# Patient Record
Sex: Female | Born: 2000 | Marital: Single | Smoking: Never smoker
Health system: Southern US, Community
[De-identification: ages and names within clinical notes are randomized; demographics above are authoritative.]

---
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
## 2022-07-21 ENCOUNTER — Telehealth: Payer: BC Managed Care – PPO

## 2022-07-22 ENCOUNTER — Encounter (HOSPITAL_COMMUNITY): Payer: Self-pay

## 2022-07-22 ENCOUNTER — Ambulatory Visit (HOSPITAL_COMMUNITY)
Admission: RE | Admit: 2022-07-22 | Discharge: 2022-07-22 | Disposition: A | Discharge status: Home / Self Care | Payer: BC Managed Care – PPO | Source: Ambulatory Visit | Attending: Internal Medicine | Admitting: Internal Medicine

## 2022-07-22 VITALS — BP 112/76 | HR 82 | Temp 97.8°F | Resp 18

## 2022-07-22 DIAGNOSIS — Z3202 Encounter for pregnancy test, result negative: Secondary | ICD-10-CM | POA: Insufficient documentation

## 2022-07-22 DIAGNOSIS — N3001 Acute cystitis with hematuria: Secondary | ICD-10-CM | POA: Insufficient documentation

## 2022-07-22 LAB — POCT URINALYSIS DIPSTICK, ED / UC
Bilirubin Urine: NEGATIVE
Glucose, UA: NEGATIVE mg/dL
Ketones, ur: 40 mg/dL — AB
Nitrite: NEGATIVE
Protein, ur: 100 mg/dL — AB
Specific Gravity, Urine: 1.025 (ref 1.005–1.030)
Urobilinogen, UA: 1 mg/dL (ref 0.0–1.0)
pH: 7.5 (ref 5.0–8.0)

## 2022-07-22 LAB — POC URINE PREG, ED: Preg Test, Ur: NEGATIVE

## 2022-07-22 MED ORDER — CEPHALEXIN 500 MG PO CAPS: 500.0000 mg | ORAL_CAPSULE | Freq: Two times a day (BID) | ORAL | 0 refills | Status: AC

## 2022-07-22 NOTE — ED Triage Notes (Signed)
Pt reports dysuria for several days.

## 2022-07-22 NOTE — ED Provider Notes (Signed)
[facility]    CSN: [id number] Arrival date & time: 07/22/22  1845      History   Chief Complaint Chief Complaint  Patient presents with   Urinary Frequency    Burning sensation when peeing Blood in urine - Entered by patient    HPI Kimberly Paul is a 22 y.o. female.   Patient presents to urgent care for evaluation of gross hematuria, dysuria, and urinary frequency that started 3-4 days ago. Denies history of frequent UTIs and recent antibiotic use. She is not a diabetic and does not take an SGLT-2 inhibitor. No nausea, vomiting, diarrhea, abdominal pain, low back pain, fever/chills, flank pain, viral URI symptoms, headache, dizziness, or decreased appetite. She is not a smoker and denies drug use. She admits to drinking only 1 bottle of water per day and excessive soda intake. She also admits to drinking lots of soda and caffeine. No vaginal symptoms or concern for STD. Unsure of pregnancy status, last menstrual cycle was 06/26/22 and she is sexually active with males without condom use. She has not attempted use of any over the counter medicines before coming to urgent care for symptoms.    Urinary Frequency    History reviewed. No pertinent past medical history.  Patient Active Problem List   Diagnosis Date Noted   Chronic pain of right knee 12/27/2020   Skin tag of perianal region 12/02/2017   Vitamin D deficiency 09/26/2014   Vaginitis 06/12/2014    History reviewed. No pertinent surgical history.  OB History   No obstetric history on file.      Home Medications    Prior to Admission medications   Medication Sig Start Date End Date Taking? Authorizing Provider  cephALEXin (KEFLEX) 500 MG capsule Take 1 capsule (500 mg total) by mouth 2 (two) times daily for 7 days. 07/22/22 07/29/22 Yes Geraldina Parrott, Stasia Cavalier, FNP    Family History Family History  Problem Relation Age of Onset   Obesity Mother    Hyperlipidemia Father    Obesity Father     Diabetes Maternal Grandmother    Diabetes Paternal Grandmother    Diabetes Paternal Grandfather    Asthma Brother    Hypertension Maternal Aunt     Social History Social History   Tobacco Use   Smoking status: Never   Smokeless tobacco: Never  Substance Use Topics   Drug use: Never     Allergies   Patient has no known allergies.   Review of Systems Review of Systems  Genitourinary:  Positive for frequency.     Physical Exam Triage Vital Signs ED Triage Vitals [07/22/22 1939]  Enc Vitals Group     BP 112/76     Pulse Rate 82     Resp 18     Temp 97.8 F (36.6 C)     Temp Source Oral     SpO2 100 %     Weight      Height      Head Circumference      Peak Flow      Pain Score      Pain Loc      Pain Edu?      Excl. in [place]?    No data found.  Updated Vital Signs BP 112/76 (BP Location: Left Arm)   Pulse 82   Temp 97.8 F (36.6 C) (Oral)   Resp 18   LMP 06/26/2022 (Approximate)   SpO2 100%   Visual Acuity Right Eye Distance:  Left Eye Distance:   Bilateral Distance:    Right Eye Near:   Left Eye Near:    Bilateral Near:     Physical Exam   UC Treatments / Results  Labs (all labs ordered are listed, but only abnormal results are displayed) Labs Reviewed  URINE CULTURE - Abnormal; Notable for the following components:      Result Value   Culture >=100,000 COLONIES/mL ESCHERICHIA COLI (*)    Organism ID, Bacteria ESCHERICHIA COLI (*)    All other components within normal limits  POCT URINALYSIS DIPSTICK, ED / UC - Abnormal; Notable for the following components:   Ketones, ur 40 (*)    Hgb urine dipstick LARGE (*)    Protein, ur 100 (*)    Leukocytes,Ua SMALL (*)    All other components within normal limits  POC URINE PREG, ED    EKG   Radiology No results found.  Procedures Procedures (including critical care time)  Medications Ordered in UC Medications - No data to display  Initial Impression / Assessment and Plan / UC  Course  I have reviewed the triage vital signs and the nursing notes.  Pertinent labs & imaging results that were available during my care of the patient were reviewed by me and considered in my medical decision making (see chart for details).   1. Acute cystitis with hematuria, negative pregnancy test Presentation is consistent with acute uncomplicated cystitis. Patient is nontoxic in appearance with hemodynamically stable vital signs. Low suspicion for acute pyelonephritis. Low suspicion for kidney stone or infected stone. Urine pregnancy is negative. No indication for labs or imaging at this time.  Keflex sent to pharmacy. Denies allergies to antibiotics. Urine culture pending. Patient to push fluids to stay well hydrated and reduce intake of known urinary irritants.  Discussed physical exam and available lab work findings in clinic with patient.  Counseled patient regarding appropriate use of medications and potential side effects for all medications recommended or prescribed today. Discussed red flag signs and symptoms of worsening condition,when to call the PCP office, return to urgent care, and when to seek higher level of care in the emergency department. Patient verbalizes understanding and agreement with plan. All questions answered. Patient discharged in stable condition.    Final Clinical Impressions(s) / UC Diagnoses   Final diagnoses:  Acute cystitis with hematuria  Negative pregnancy test     Discharge Instructions      Your urine shows you likely have a urinary tract infection. I have sent your urine for culture to confirm this. We will go ahead and have you start taking antibiotics due to your symptoms.  Take antibiotic as directed.  (Keflex '500mg'$  every 12 hours for 7 days) If you develop diarrhea while taking this medication you may purchase an over-the-counter probiotic or eat yogurt with live active cultures.  To avoid GI upset please take this medication with food. I  have sent your urine for culture to see what type of bacteria grows. We will call you if we need to change the treatment plan based on the results of your urine culture.  If you develop any new or worsening symptoms or do not improve in the next 2 to 3 days, please return.  If your symptoms are severe, please go to the emergency room.  Follow-up with your primary care provider for further evaluation and management of your symptoms as well as ongoing wellness visits.  I hope you feel better!  ED Prescriptions     Medication Sig Dispense Auth. Provider   cephALEXin (KEFLEX) 500 MG capsule Take 1 capsule (500 mg total) by mouth 2 (two) times daily for 7 days. 14 capsule Talbot Grumbling, FNP      PDMP not reviewed this encounter.   Talbot Grumbling, [place] 07/24/22 2122

## 2022-07-22 NOTE — Discharge Instructions (Addendum)
Your urine shows you likely have a urinary tract infection. I have sent your urine for culture to confirm this. We will go ahead and have you start taking antibiotics due to your symptoms.  Take antibiotic as directed.  (Keflex '500mg'$  every 12 hours for 7 days) If you develop diarrhea while taking this medication you may purchase an over-the-counter probiotic or eat yogurt with live active cultures.  To avoid GI upset please take this medication with food. I have sent your urine for culture to see what type of bacteria grows. We will call you if we need to change the treatment plan based on the results of your urine culture.  If you develop any new or worsening symptoms or do not improve in the next 2 to 3 days, please return.  If your symptoms are severe, please go to the emergency room.  Follow-up with your primary care provider for further evaluation and management of your symptoms as well as ongoing wellness visits.  I hope you feel better!

## 2022-07-24 LAB — URINE CULTURE: Culture: >=100000 — AB

## 2022-08-04 DIAGNOSIS — Z113 Encounter for screening for infections with a predominantly sexual mode of transmission: Secondary | ICD-10-CM | POA: Diagnosis not present

## 2022-08-04 DIAGNOSIS — Z01419 Encounter for gynecological examination (general) (routine) without abnormal findings: Secondary | ICD-10-CM | POA: Diagnosis not present

## 2022-08-04 DIAGNOSIS — Z3202 Encounter for pregnancy test, result negative: Secondary | ICD-10-CM | POA: Diagnosis not present

## 2022-09-03 DIAGNOSIS — N898 Other specified noninflammatory disorders of vagina: Secondary | ICD-10-CM | POA: Diagnosis not present

## 2022-09-03 DIAGNOSIS — Z113 Encounter for screening for infections with a predominantly sexual mode of transmission: Secondary | ICD-10-CM | POA: Diagnosis not present

## 2022-09-19 DIAGNOSIS — N939 Abnormal uterine and vaginal bleeding, unspecified: Secondary | ICD-10-CM | POA: Diagnosis not present

## 2022-10-06 DIAGNOSIS — H5213 Myopia, bilateral: Secondary | ICD-10-CM | POA: Diagnosis not present

## 2022-11-10 DIAGNOSIS — Z3040 Encounter for surveillance of contraceptives, unspecified: Secondary | ICD-10-CM | POA: Diagnosis not present

## 2022-12-23 ENCOUNTER — Ambulatory Visit (HOSPITAL_COMMUNITY)
Admission: EM | Admit: 2022-12-23 | Discharge: 2022-12-23 | Disposition: A | Discharge status: Home / Self Care | Payer: BC Managed Care – PPO | Attending: Emergency Medicine | Admitting: Emergency Medicine

## 2022-12-23 ENCOUNTER — Encounter (HOSPITAL_COMMUNITY): Payer: Self-pay

## 2022-12-23 DIAGNOSIS — M25561 Pain in right knee: Secondary | ICD-10-CM

## 2022-12-23 DIAGNOSIS — G8929 Other chronic pain: Secondary | ICD-10-CM | POA: Diagnosis not present

## 2022-12-23 MED ORDER — PREDNISONE 20 MG PO TABS: 40.0000 mg | ORAL_TABLET | Freq: Every day | ORAL | 0 refills | Status: DC

## 2022-12-23 MED ORDER — CYCLOBENZAPRINE HCL 10 MG PO TABS: 10.0000 mg | ORAL_TABLET | Freq: Every day | ORAL | 0 refills | Status: DC

## 2022-12-23 NOTE — ED Provider Notes (Signed)
[facility]    CSN: [id number] Arrival date & time: 12/23/22  1206      History   Chief Complaint Chief Complaint  Patient presents with   Knee Pain    HPI Jesselyn Naab is a 22 y.o. female.   Patient presents for evaluation of anterior right knee pain and swelling present for 2 days.  Symptoms occurring intermittently.  Having to use cane for stability, independent at baseline.  Denies injury or trauma.  Has been ongoing issue since the age of 7, not currently followed by orthopedics but has seen in the past.  Denies numbness or tingling.  Has attempted use of Tylenol which has been ineffective.     History reviewed. No pertinent past medical history.  Patient Active Problem List   Diagnosis Date Noted   Chronic pain of right knee 12/27/2020   Skin tag of perianal region 12/02/2017   Vitamin D deficiency 09/26/2014   Vaginitis 06/12/2014    History reviewed. No pertinent surgical history.  OB History   No obstetric history on file.      Home Medications    Prior to Admission medications   Not on File    Family History Family History  Problem Relation Age of Onset   Obesity Mother    Hyperlipidemia Father    Obesity Father    Diabetes Maternal Grandmother    Diabetes Paternal Grandmother    Diabetes Paternal Grandfather    Asthma Brother    Hypertension Maternal Aunt     Social History Social History   Tobacco Use   Smoking status: Never   Smokeless tobacco: Never  Vaping Use   Vaping status: Never Used  Substance Use Topics   Alcohol use: Never   Drug use: Never     Allergies   Patient has no known allergies.   Review of Systems Review of Systems   Physical Exam Triage Vital Signs ED Triage Vitals  Encounter Vitals Group     BP 12/23/22 1240 112/73     Systolic BP Percentile --      Diastolic BP Percentile --      Pulse Rate 12/23/22 1240 70     Resp --      Temp 12/23/22 1240 98.5 F (36.9 C)     Temp  Source 12/23/22 1240 Oral     SpO2 12/23/22 1240 98 %     Weight 12/23/22 1240 97 lb (44 kg)     Height 12/23/22 1240 5' (1.524 m)     Head Circumference --      Peak Flow --      Pain Score 12/23/22 1238 8     Pain Loc --      Pain Education --      Exclude from Growth Chart --    No data found.  Updated Vital Signs BP 112/73 (BP Location: Left Arm)   Pulse 70   Temp 98.5 F (36.9 C) (Oral)   Ht 5' (1.524 m)   Wt 97 lb (44 kg)   LMP 12/22/2022 (Exact Date)   SpO2 98%   BMI 18.94 kg/m   Visual Acuity Right Eye Distance:   Left Eye Distance:   Bilateral Distance:    Right Eye Near:   Left Eye Near:    Bilateral Near:     Physical Exam Constitutional:      Appearance: Normal appearance.  Eyes:     Extraocular Movements: Extraocular movements intact.  Pulmonary:  Effort: Pulmonary effort is normal.  Musculoskeletal:     Comments: Tenderness present to the anterior and lateral aspects of the anterior knee with mild to moderate swelling along the anterior patellar, no ecchymosis or deformity, 2 + popliteal pulse, able to complete range of motion but elicits pain with all movements  Neurological:     Mental Status: She is alert and oriented to person, place, and time. Mental status is at baseline.      UC Treatments / Results  Labs (all labs ordered are listed, but only abnormal results are displayed) Labs Reviewed - No data to display  EKG   Radiology No results found.  Procedures Procedures (including critical care time)  Medications Ordered in UC Medications - No data to display  Initial Impression / Assessment and Plan / UC Course  I have reviewed the triage vital signs and the nursing notes.  Pertinent labs & imaging results that were available during my care of the patient were reviewed by me and considered in my medical decision making (see chart for details).  Chronic pain of right knee  Denies new injury therefore imaging deferred,  consistent with prior presentation of knee pain per chart review we will move forward with management of symptoms, declined in office muscular injection, prescribed prednisone and Flexeril for outpatient use, compression sleeve given in office to be used for stability and support, may continue use of cane, recommended RICE, heat massage stretching and activity as tolerated, giving walking referral to orthopedics for reevaluation if symptoms persist or worsen Final Clinical Impressions(s) / UC Diagnoses   Final diagnoses:  Chronic pain of right knee     Discharge Instructions      Today you were evaluated for your chronic knee pain which is currently flared, as there is no new injury we will not complete imaging today  You have been given an injection of Toradol today here in the office, this will start to reduce pain and 30 minutes to an hour  Starting tomorrow take prednisone every morning with food for 5 days to continue the above process, may take Tylenol in addition to this  May continue use of cane for stability and support May also use compression sleeve as needed  May use ice or heat over the affected area in 10 to 15-minute intervals  May continue activity as tolerated  May elevate on pillows whenever sitting and lying  If your pain continues to persist or worsens you may follow-up with your orthopedic doctor for reevaluation   ED Prescriptions   None    PDMP not reviewed this encounter.   Valinda Hoar, NP 12/23/22 1326

## 2022-12-23 NOTE — Discharge Instructions (Addendum)
Today you were evaluated for your chronic knee pain which is currently flared, as there is no new injury we will not complete imaging today  take prednisone every morning with food for 5 days to continue the above process, may take Tylenol in addition to this  May use muscle relaxant at bedtime as needed for additional comfort  Moving forward if your knee pain flares again I would recommend you use ibuprofen or naproxen in addition to Tylenol for management  May continue use of cane for stability and support , May also use compression sleeve as needed  May use ice or heat over the affected area in 10 to 15-minute intervals  May continue activity as tolerated  May elevate on pillows whenever sitting and lying  If your pain continues to persist or worsens you may follow-up with orthopedic doctor for reevaluation, information is listed on front page, may go to any of the above offices

## 2022-12-23 NOTE — ED Triage Notes (Signed)
R-knee pain and swelling x 2 days no falls or trauma. Patient has had knee pain on and off since she was 7. States has never been this bad, no known diagnosis.

## 2022-12-26 DIAGNOSIS — M25561 Pain in right knee: Secondary | ICD-10-CM | POA: Diagnosis not present

## 2023-01-07 DIAGNOSIS — M25561 Pain in right knee: Secondary | ICD-10-CM | POA: Diagnosis not present

## 2023-01-19 DIAGNOSIS — H52223 Regular astigmatism, bilateral: Secondary | ICD-10-CM | POA: Diagnosis not present

## 2023-01-19 DIAGNOSIS — H5213 Myopia, bilateral: Secondary | ICD-10-CM | POA: Diagnosis not present

## 2023-01-30 DIAGNOSIS — M25561 Pain in right knee: Secondary | ICD-10-CM | POA: Diagnosis not present

## 2023-08-05 DIAGNOSIS — Z202 Contact with and (suspected) exposure to infections with a predominantly sexual mode of transmission: Secondary | ICD-10-CM | POA: Diagnosis not present

## 2023-08-05 DIAGNOSIS — Z113 Encounter for screening for infections with a predominantly sexual mode of transmission: Secondary | ICD-10-CM | POA: Diagnosis not present

## 2023-08-05 DIAGNOSIS — Z01419 Encounter for gynecological examination (general) (routine) without abnormal findings: Secondary | ICD-10-CM | POA: Diagnosis not present

## 2023-09-24 ENCOUNTER — Encounter: Payer: Self-pay | Admitting: Family Medicine

## 2023-09-24 ENCOUNTER — Ambulatory Visit (INDEPENDENT_AMBULATORY_CARE_PROVIDER_SITE_OTHER): Payer: BC Managed Care – PPO | Admitting: Family Medicine

## 2023-09-24 VITALS — BP 106/69 | HR 80 | Ht 60.0 in | Wt 107.4 lb

## 2023-09-24 DIAGNOSIS — Z1159 Encounter for screening for other viral diseases: Secondary | ICD-10-CM | POA: Diagnosis not present

## 2023-09-24 DIAGNOSIS — Z130 Encounter for screening for diseases of the blood and blood-forming organs and certain disorders involving the immune mechanism: Secondary | ICD-10-CM | POA: Diagnosis not present

## 2023-09-24 DIAGNOSIS — Z0000 Encounter for general adult medical examination without abnormal findings: Secondary | ICD-10-CM

## 2023-09-24 DIAGNOSIS — Z13220 Encounter for screening for lipoid disorders: Secondary | ICD-10-CM | POA: Diagnosis not present

## 2023-09-24 DIAGNOSIS — Z23 Encounter for immunization: Secondary | ICD-10-CM | POA: Diagnosis not present

## 2023-09-24 DIAGNOSIS — Z7689 Persons encountering health services in other specified circumstances: Secondary | ICD-10-CM

## 2023-09-24 NOTE — Progress Notes (Signed)
 New Patient Office Visit  Subjective    Patient ID: Kimberly Paul, female    DOB: [birth year]  Age: 23 y.o. MRN: [id number]  CC:  Chief Complaint  Patient presents with   New Patient (Initial Visit)    HPI Kimberly Paul presents to establish care and for routine annual exam. Patient denies known chronic med issues or taking meds on a regular basis or acute complaints.    Outpatient Encounter Medications as of 09/24/2023  Medication Sig   VIENVA 0.1-20 MG-MCG tablet    cyclobenzaprine  (FLEXERIL ) 10 MG tablet Take 1 tablet (10 mg total) by mouth at bedtime.   predniSONE  (DELTASONE ) 20 MG tablet Take 2 tablets (40 mg total) by mouth daily.   No facility-administered encounter medications on file as of 09/24/2023.    History reviewed. No pertinent past medical history.  History reviewed. No pertinent surgical history.  Family History  Problem Relation Age of Onset   Obesity Mother    Hyperlipidemia Father    Obesity Father    Diabetes Maternal Grandmother    Diabetes Paternal Grandmother    Diabetes Paternal Grandfather    Asthma Brother    Hypertension Maternal Aunt     Social History   Socioeconomic History   Marital status: Single    Spouse name: Not on file   Number of children: Not on file   Years of education: Not on file   Highest education level: 12th grade  Occupational History   Not on file  Tobacco Use   Smoking status: Never   Smokeless tobacco: Never  Vaping Use   Vaping status: Never Used  Substance and Sexual Activity   Alcohol use: Never   Drug use: Never   Sexual activity: Not Currently  Other Topics Concern   Not on file  Social History Narrative   Not on file   Social Drivers of Health   Financial Resource Strain: Low Risk  (09/23/2023)   Overall Financial Resource Strain (CARDIA)    Difficulty of Paying Living Expenses: Not hard at all  Food Insecurity: No Food Insecurity (09/23/2023)   Hunger Vital Sign    Worried  About Running Out of Food in the Last Year: Never true    Ran Out of Food in the Last Year: Never true  Transportation Needs: No Transportation Needs (09/23/2023)   PRAPARE - Administrator, Civil Service (Medical): No    Lack of Transportation (Non-Medical): No  Physical Activity: Unknown (09/23/2023)   Exercise Vital Sign    Days of Exercise per Week: 0 days    Minutes of Exercise per Session: Not on file  Stress: No Stress Concern Present (09/23/2023)   [employer] of Occupational Health - Occupational Stress Questionnaire    Feeling of Stress : Only a little  Social Connections: Socially Isolated (09/23/2023)   Social Connection and Isolation Panel [NHANES]    Frequency of Communication with Friends and Family: Never    Frequency of Social Gatherings with Friends and Family: Once a week    Attends Religious Services: 1 to 4 times per year    Active Member of [employer] or Organizations: No    Attends Engineer, structural: Not on file    Marital Status: Never married  Intimate Partner Violence: Not on file    Review of Systems  All other systems reviewed and are negative.       Objective   BP 106/69 (BP Location: Left Arm, Patient Position:  Sitting, Cuff Size: Normal)   Pulse 80   Ht 5' (1.524 m)   Wt 107 lb 6.4 oz (48.7 kg)   LMP 09/03/2023   SpO2 98%   BMI 20.98 kg/m   Physical Exam Vitals and nursing note reviewed.  Constitutional:      General: She is not in acute distress. HENT:     Head: Normocephalic and atraumatic.     Right Ear: Tympanic membrane, ear canal and external ear normal.     Left Ear: Tympanic membrane, ear canal and external ear normal.     Nose: Nose normal.     Mouth/Throat:     Mouth: Mucous membranes are moist.     Pharynx: Oropharynx is clear.  Eyes:     Conjunctiva/sclera: Conjunctivae normal.     Pupils: Pupils are equal, round, and reactive to light.  Neck:     Thyroid: No thyromegaly.  Cardiovascular:      Rate and Rhythm: Normal rate and regular rhythm.     Heart sounds: Normal heart sounds. No murmur heard. Pulmonary:     Effort: Pulmonary effort is normal. No respiratory distress.     Breath sounds: Normal breath sounds.  Abdominal:     General: There is no distension.     Palpations: Abdomen is soft. There is no mass.     Tenderness: There is no abdominal tenderness.  Musculoskeletal:        General: Normal range of motion.     Cervical back: Normal range of motion and neck supple.  Skin:    General: Skin is warm and dry.  Neurological:     General: No focal deficit present.     Mental Status: She is alert and oriented to person, place, and time.  Psychiatric:        Mood and Affect: Mood normal.        Behavior: Behavior normal.         Assessment & Plan:   Annual physical exam -     CMP14+EGFR  Screening for deficiency anemia -     CBC with Differential/Platelet  Screening for lipid disorders -     Lipid panel  Need for hepatitis C screening test -     Hepatitis C antibody  Encounter to establish care  Other orders -     Meningococcal B, OMV     Return in about 1 year (around 09/23/2024) for physical.   Arlo Lama, MD

## 2023-09-25 LAB — CMP14+EGFR
ALT: 10 IU/L (ref 0–32)
AST: 18 IU/L (ref 0–40)
Albumin: 4.7 g/dL (ref 4.0–5.0)
Alkaline Phosphatase: 87 IU/L (ref 42–106)
BUN/Creatinine Ratio: 13 (ref 9–23)
BUN: 7 mg/dL (ref 6–20)
Bilirubin Total: 0.2 mg/dL (ref 0.0–1.2)
CO2: 22 mmol/L (ref 20–29)
Calcium: 9.8 mg/dL (ref 8.7–10.2)
Chloride: 101 mmol/L (ref 96–106)
Creatinine, Ser: 0.53 mg/dL — ABNORMAL LOW (ref 0.57–1.00)
Globulin, Total: 2.6 g/dL (ref 1.5–4.5)
Glucose: 64 mg/dL — ABNORMAL LOW (ref 70–99)
Potassium: 4.4 mmol/L (ref 3.5–5.2)
Sodium: 139 mmol/L (ref 134–144)
Total Protein: 7.3 g/dL (ref 6.0–8.5)
eGFR: 136 mL/min/{1.73_m2} (ref >59)

## 2023-09-25 LAB — LIPID PANEL
Chol/HDL Ratio: 3.5 ratio (ref 0.0–4.4)
Cholesterol, Total: 168 mg/dL (ref 100–199)
HDL: 48 mg/dL (ref >39)
LDL Chol Calc (NIH): 102 mg/dL — ABNORMAL HIGH (ref 0–99)
Triglycerides: 96 mg/dL (ref 0–149)
VLDL Cholesterol Cal: 18 mg/dL (ref 5–40)

## 2023-09-25 LAB — CBC WITH DIFFERENTIAL/PLATELET
Basophils Absolute: 0 10*3/uL (ref 0.0–0.2)
Basos: 0 %
EOS (ABSOLUTE): 0.1 10*3/uL (ref 0.0–0.4)
Eos: 2 %
Hematocrit: 39.6 % (ref 34.0–46.6)
Hemoglobin: 12.3 g/dL (ref 11.1–15.9)
Immature Grans (Abs): 0 10*3/uL (ref 0.0–0.1)
Immature Granulocytes: 0 %
Lymphocytes Absolute: 1.6 10*3/uL (ref 0.7–3.1)
Lymphs: 33 %
MCH: 25.8 pg — ABNORMAL LOW (ref 26.6–33.0)
MCHC: 31.1 g/dL — ABNORMAL LOW (ref 31.5–35.7)
MCV: 83 fL (ref 79–97)
Monocytes Absolute: 0.5 10*3/uL (ref 0.1–0.9)
Monocytes: 10 %
Neutrophils Absolute: 2.7 10*3/uL (ref 1.4–7.0)
Neutrophils: 55 %
Platelets: 302 10*3/uL (ref 150–450)
RBC: 4.77 x10E6/uL (ref 3.77–5.28)
RDW: 14.8 % (ref 11.7–15.4)
WBC: 4.9 10*3/uL (ref 3.4–10.8)

## 2023-09-25 LAB — HEPATITIS C ANTIBODY: Hep C Virus Ab: NONREACTIVE

## 2023-10-23 ENCOUNTER — Ambulatory Visit: Payer: Self-pay | Admitting: Family Medicine

## 2024-01-26 ENCOUNTER — Emergency Department (HOSPITAL_COMMUNITY)
Admission: EM | Admit: 2024-01-26 | Discharge: 2024-01-26 | Disposition: A | Discharge status: Home / Self Care | Attending: Emergency Medicine | Admitting: Emergency Medicine

## 2024-01-26 ENCOUNTER — Emergency Department (HOSPITAL_COMMUNITY)

## 2024-01-26 DIAGNOSIS — G8929 Other chronic pain: Secondary | ICD-10-CM | POA: Insufficient documentation

## 2024-01-26 DIAGNOSIS — M25561 Pain in right knee: Secondary | ICD-10-CM | POA: Insufficient documentation

## 2024-01-26 MED ORDER — KETOROLAC TROMETHAMINE 15 MG/ML IJ SOLN
15.0000 mg | Freq: Once | INTRAMUSCULAR | Status: AC
  Administered 2024-01-26: 15 mg via INTRAMUSCULAR
  Filled 2024-01-26: qty 1

## 2024-01-26 NOTE — Discharge Instructions (Signed)
 Take Tylenol  and ibuprofen  as discussed below.  Continue wearing the compression knee brace that you have. FOLLOW UP WITH THE ORTHOPEDIC DOCTOR (BONE DOCTOR).  Please use Tylenol  or ibuprofen  for pain.  You may use 600 mg ibuprofen  every 6 hours or 1000 mg of Tylenol  every 6 hours.  You may choose to alternate between the 2.  This would be most effective.  Not to exceed 4 g of Tylenol  within 24 hours.  Not to exceed 3200 mg ibuprofen  24 hours.

## 2024-01-26 NOTE — ED Triage Notes (Signed)
 Pt presents to ED for pain from 6/10 right knee to foot.  Chronic problem that has been exacerbated since Saturday.  Pt is having trouble bearing weight on it.

## 2024-01-26 NOTE — ED Provider Notes (Signed)
 [place] EMERGENCY DEPARTMENT AT [facility] Provider Note   CSN: [id number] Arrival date & time: 01/26/24  1044     Patient presents with: Leg Pain   Kimberly Paul is a 23 y.o. female.    Leg Pain  Patient is a 23 year old female with no pertinent past medical history present emergency room today with complaints of right knee pain that has been ongoing since she was 23 years old she states no injuries but rather chronic pain that seems to flareup and go away seems that this occurs 2-4 times a year.  No acute injury today or yesterday, she states that it has been ongoing since this weekend when she went to a concert on Saturday and was walking more than usual and states that she has had achy pain in her knee since.  She denies any calf pain or leg swelling.  No history of DVT      Prior to Admission medications   Medication Sig Start Date End Date Taking? Authorizing Provider  VIENVA 0.1-20 MG-MCG tablet  08/06/22   [provider]    Allergies: Patient has no known allergies.    Review of Systems  Updated Vital Signs There were no vitals taken for this visit.  Physical Exam Vitals and nursing note reviewed.  Constitutional:      General: She is not in acute distress.    Appearance: Normal appearance. She is not ill-appearing.  HENT:     Head: Normocephalic and atraumatic.     Mouth/Throat:     Mouth: Mucous membranes are moist.  Eyes:     General: No scleral icterus.       Right eye: No discharge.        Left eye: No discharge.     Conjunctiva/sclera: Conjunctivae normal.  Pulmonary:     Effort: Pulmonary effort is normal.     Breath sounds: No stridor.  Musculoskeletal:     Right lower leg: No edema.     Left lower leg: No edema.     Comments: Symmetric lower extremities, no calf tenderness.  Discomfort with range of motion of right knee at the extremes of the range however she has normal full range of motion both actively and  passively.  DP PT pulses 3+ and symmetric, cap refill less than 2 seconds in great toe bilaterally, sensation normal in bilateral lower extremities.  Skin:    General: Skin is warm and dry.     Capillary Refill: Capillary refill takes less than 2 seconds.  Neurological:     Mental Status: She is alert and oriented to person, place, and time. Mental status is at baseline.     (all labs ordered are listed, but only abnormal results are displayed) Labs Reviewed - No data to display  EKG: None  Radiology: No results found.   Procedures   Medications Ordered in the ED  ketorolac  (TORADOL ) 15 MG/ML injection 15 mg (has no administration in time range)                                    Medical Decision Making  Patient is a 23 year old female with no pertinent past medical history present emergency room today with complaints of right knee pain that has been ongoing since she was 23 years old she states no injuries but rather chronic pain that seems to flareup and go away seems that this  occurs 2-4 times a year.  No acute injury today or yesterday, she states that it has been ongoing since this weekend when she went to a concert on Saturday and was walking more than usual and states that she has had achy pain in her knee since.  She denies any calf pain or leg swelling.  No history of DVT  PE: Some discomfort with ROM but able to fully range R knee, no instability on exam.   X-ray unremarkable.  Doubt DVT, good peripheral blood flow no indication that this is arterial injury.  This is a nonacute issue seems to be rather acute on chronic knee pain.  Follow-up with orthopedics.   Final diagnoses:  Chronic pain of right knee    ED Discharge Orders     None          Neldon Hamp RAMAN, [place] 01/26/24 1231    Mannie Pac T, DO 01/29/24 0720

## 2024-01-26 NOTE — ED Notes (Signed)
 Patient transported to X-ray

## 2024-02-12 DIAGNOSIS — M25561 Pain in right knee: Secondary | ICD-10-CM | POA: Diagnosis not present

## 2024-02-26 DIAGNOSIS — M25561 Pain in right knee: Secondary | ICD-10-CM | POA: Diagnosis not present

## 2024-03-11 DIAGNOSIS — M25561 Pain in right knee: Secondary | ICD-10-CM | POA: Diagnosis not present

## 2024-03-18 DIAGNOSIS — M25561 Pain in right knee: Secondary | ICD-10-CM | POA: Diagnosis not present

## 2024-03-25 DIAGNOSIS — M25561 Pain in right knee: Secondary | ICD-10-CM | POA: Diagnosis not present

## 2024-04-01 DIAGNOSIS — M25561 Pain in right knee: Secondary | ICD-10-CM | POA: Diagnosis not present

## 2024-04-08 DIAGNOSIS — M25561 Pain in right knee: Secondary | ICD-10-CM | POA: Diagnosis not present

## 2024-09-23 ENCOUNTER — Encounter: Admitting: Family Medicine
